# Patient Record
Sex: Female | Born: 2000 | Race: White | Hispanic: No | Marital: Single | Smoking: Never smoker
Health system: Southern US, Community
[De-identification: ages and names within clinical notes are randomized; demographics above are authoritative.]

## PROBLEM LIST (undated history)

## (undated) DIAGNOSIS — J45909 Unspecified asthma, uncomplicated: Secondary | ICD-10-CM

---
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
## 2020-04-16 IMAGING — CT CT ANGIOGRAPHY CHEST
2 of 6 series · 19 of 36 positions shown · IV contrast (APPLIED)
Comparison: Chest x-ray 12/16/2018

CLINICAL DATA: Chest pain

EXAM:
CT ANGIOGRAPHY CHEST WITH CONTRAST
TECHNIQUE: Multidetector CT imaging of the chest was performed using the
standard protocol during bolus administration of intravenous
contrast. Multiplanar CT image reconstructions and MIPs were
obtained to evaluate the vascular anatomy.
CONTRAST:  75mL OMNIPAQUE IOHEXOL 350 MG/ML SOLN

[Series 5: thins · axial · 0.72mm/px · z∈[-540,-316]mm · 18 of 250 slices shown]
[im 13/250  lung]
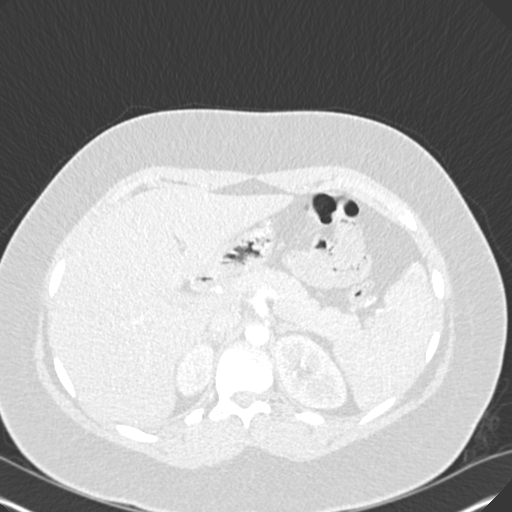
[im 25/250  mediastinal]
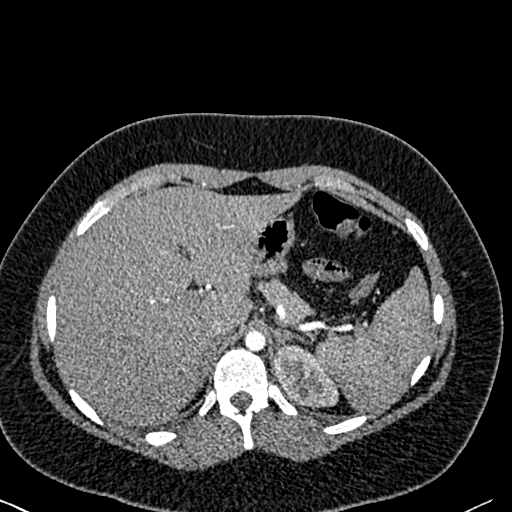
[im 38/250  lung]
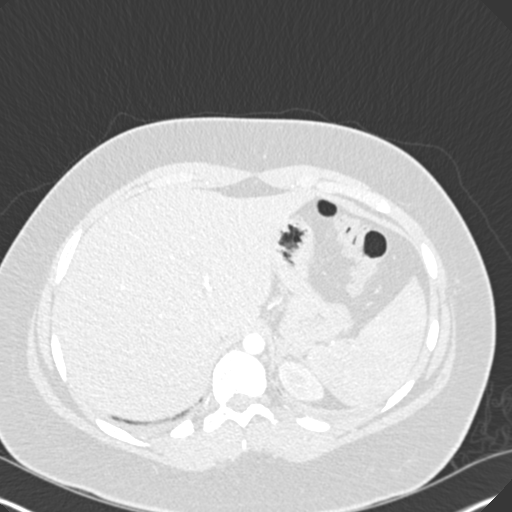
[im 50/250  mediastinal]
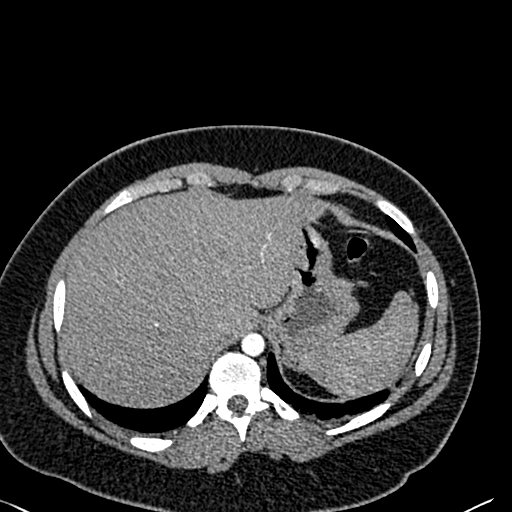
[im 63/250  lung]
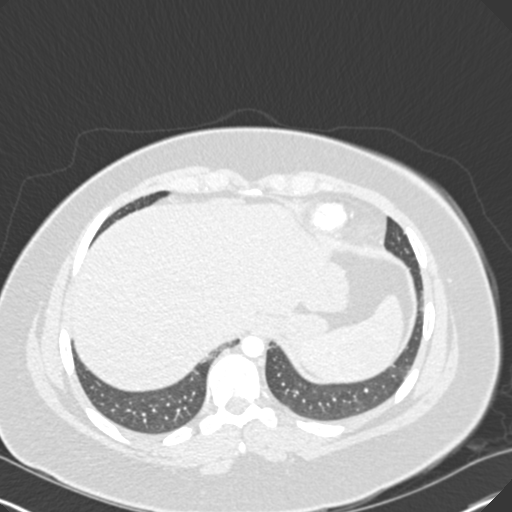
[im 75/250  mediastinal]
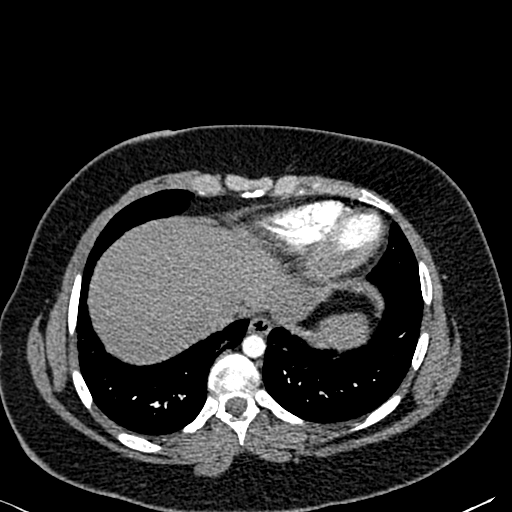
[im 88/250  lung]
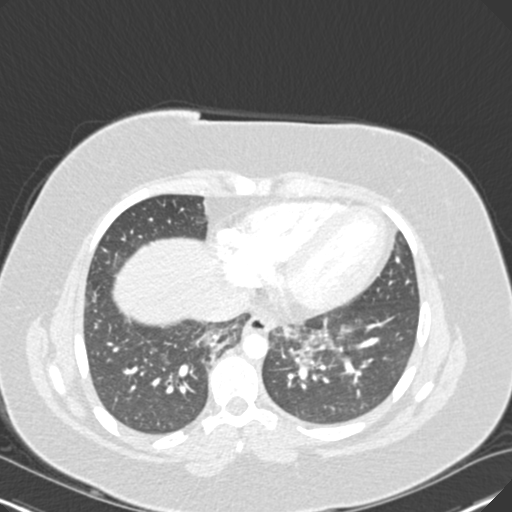
[im 100/250  mediastinal]
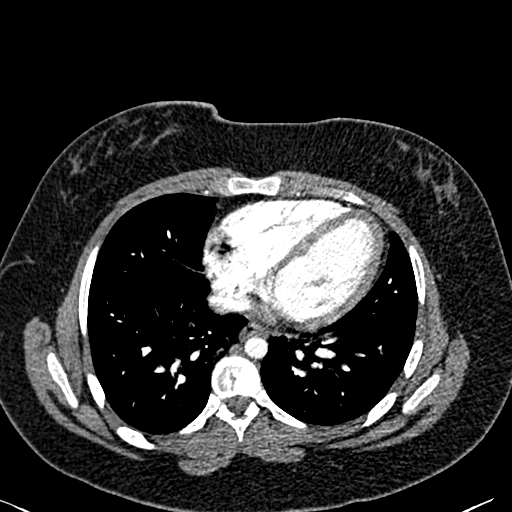
[im 113/250  lung]
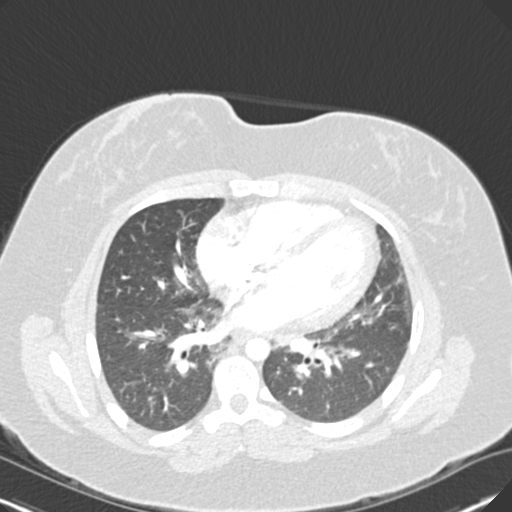
[im 137/250  mediastinal]
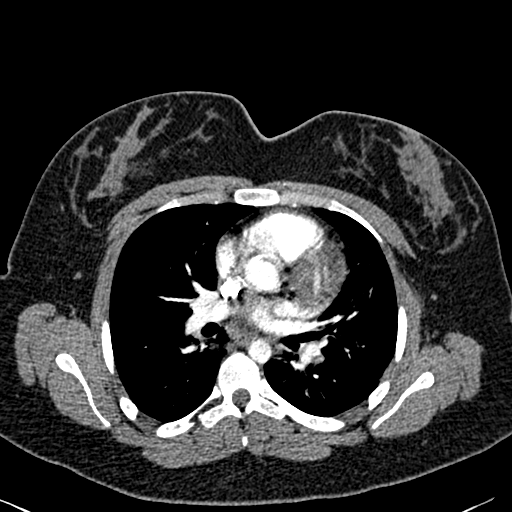
[im 150/250  lung]
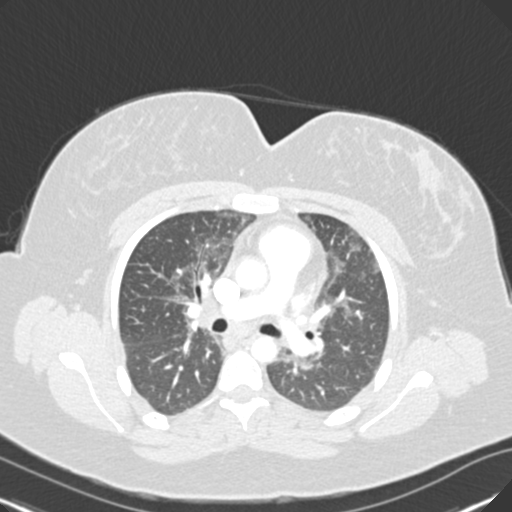
[im 162/250  mediastinal]
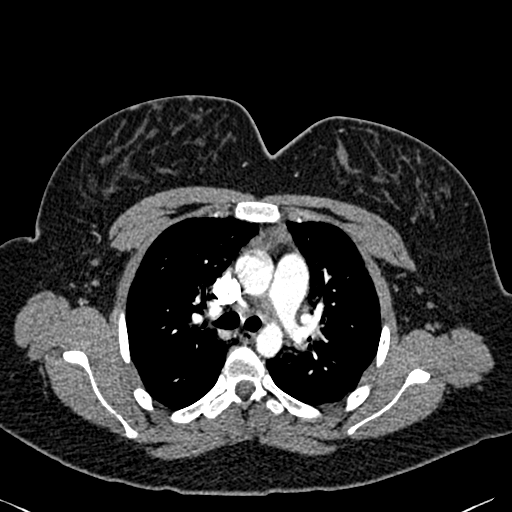
[im 175/250  lung]
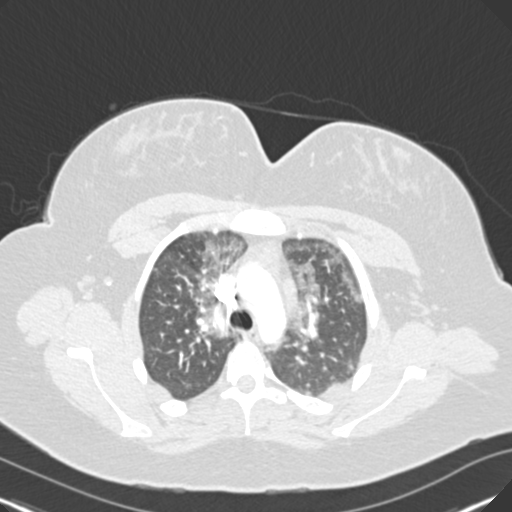
[im 187/250  mediastinal]
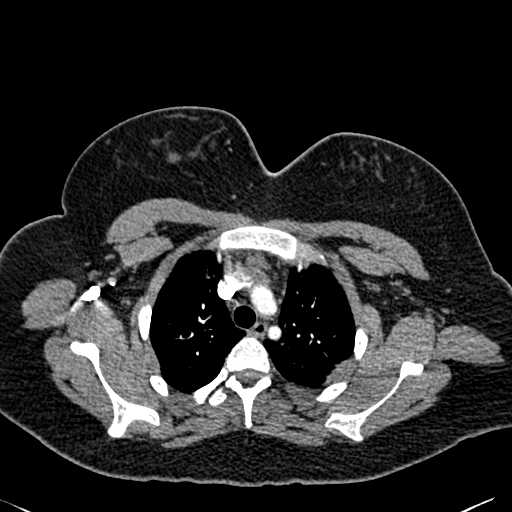
[im 200/250  lung]
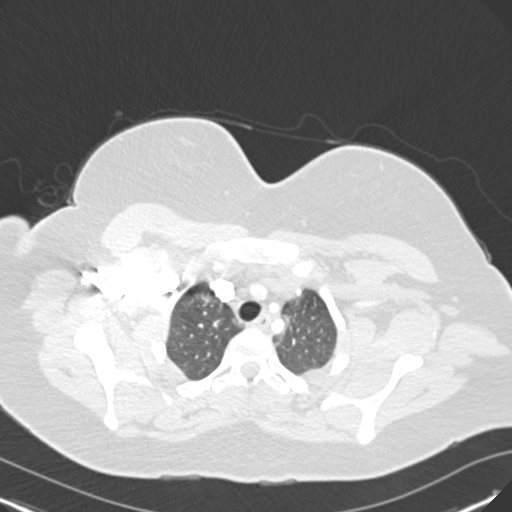
[im 212/250  mediastinal]
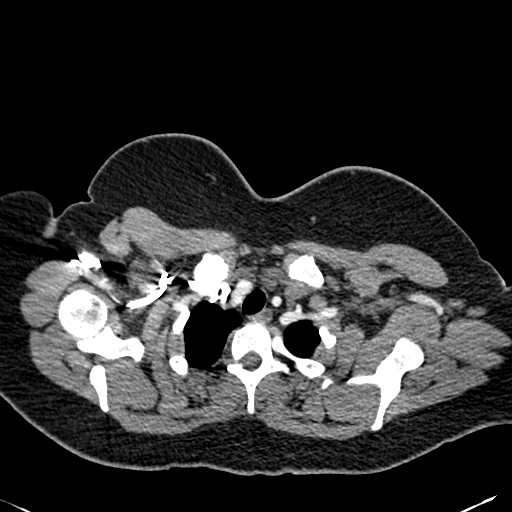
[im 225/250  lung]
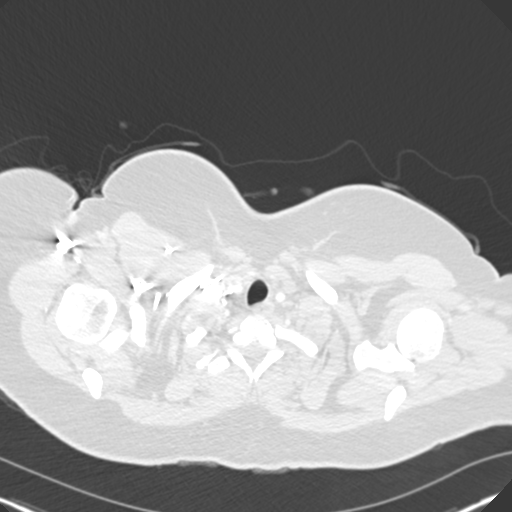
[im 237/250  mediastinal]
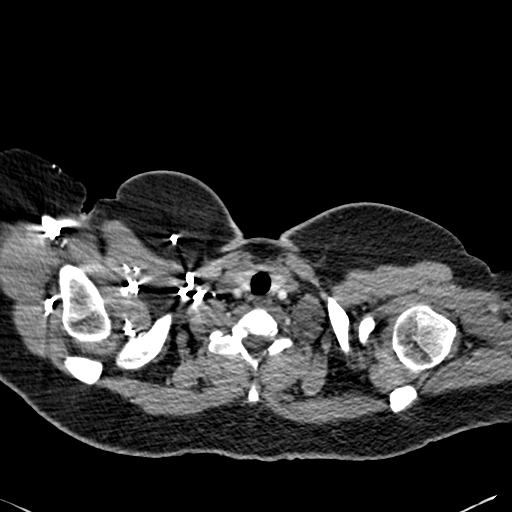

[Series 7: coronal mpr · coronal · 0.49mm/px · 1 of 106 slices shown]
[im 53/106  mediastinal]
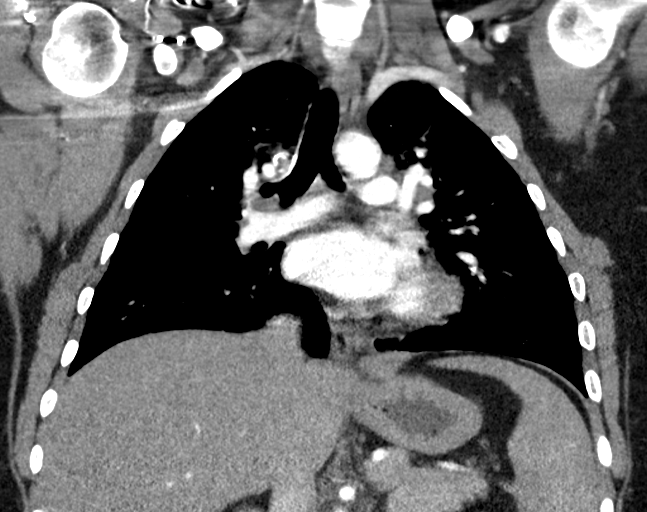

[19 of 36 positions shown; findings below may reference images not displayed]

FINDINGS: Cardiovascular: No filling defects in the pulmonary arteries to
suggest pulmonary emboli. Heart is normal size. Aorta is normal
caliber.

Mediastinum/Nodes: No mediastinal, hilar, or axillary adenopathy.
Soft tissue in the anterior mediastinum felt represent residual
thymus.

Lungs/Pleura: There are patchy ground-glass opacities noted medially
within both upper lobes and also within both lower lobes. No
effusions.

Upper Abdomen: Imaging into the upper abdomen shows no acute
findings.

Musculoskeletal: Chest wall soft tissues are unremarkable. No acute
bony abnormality.

Review of the MIP images confirms the above findings.
IMPRESSION: No evidence of pulmonary embolus.

Patchy bilateral airspace opacities medially in both upper lobes and
lower lobes. There are a spectrum of findings in the lungs which can
be seen with acute atypical infection (as well as other
non-infectious etiologies). In particular, viral pneumonia
(including [id number]) should be considered in the appropriate
clinical setting.

## 2020-04-16 IMAGING — DX PORTABLE CHEST - 1 VIEW
1 series · 1 of 1 positions shown · non-contrast
Comparison: None.

CLINICAL DATA: Chest pain, cough, congestion, shortness of breath

EXAM:
PORTABLE CHEST 1 VIEW

[chest ap]
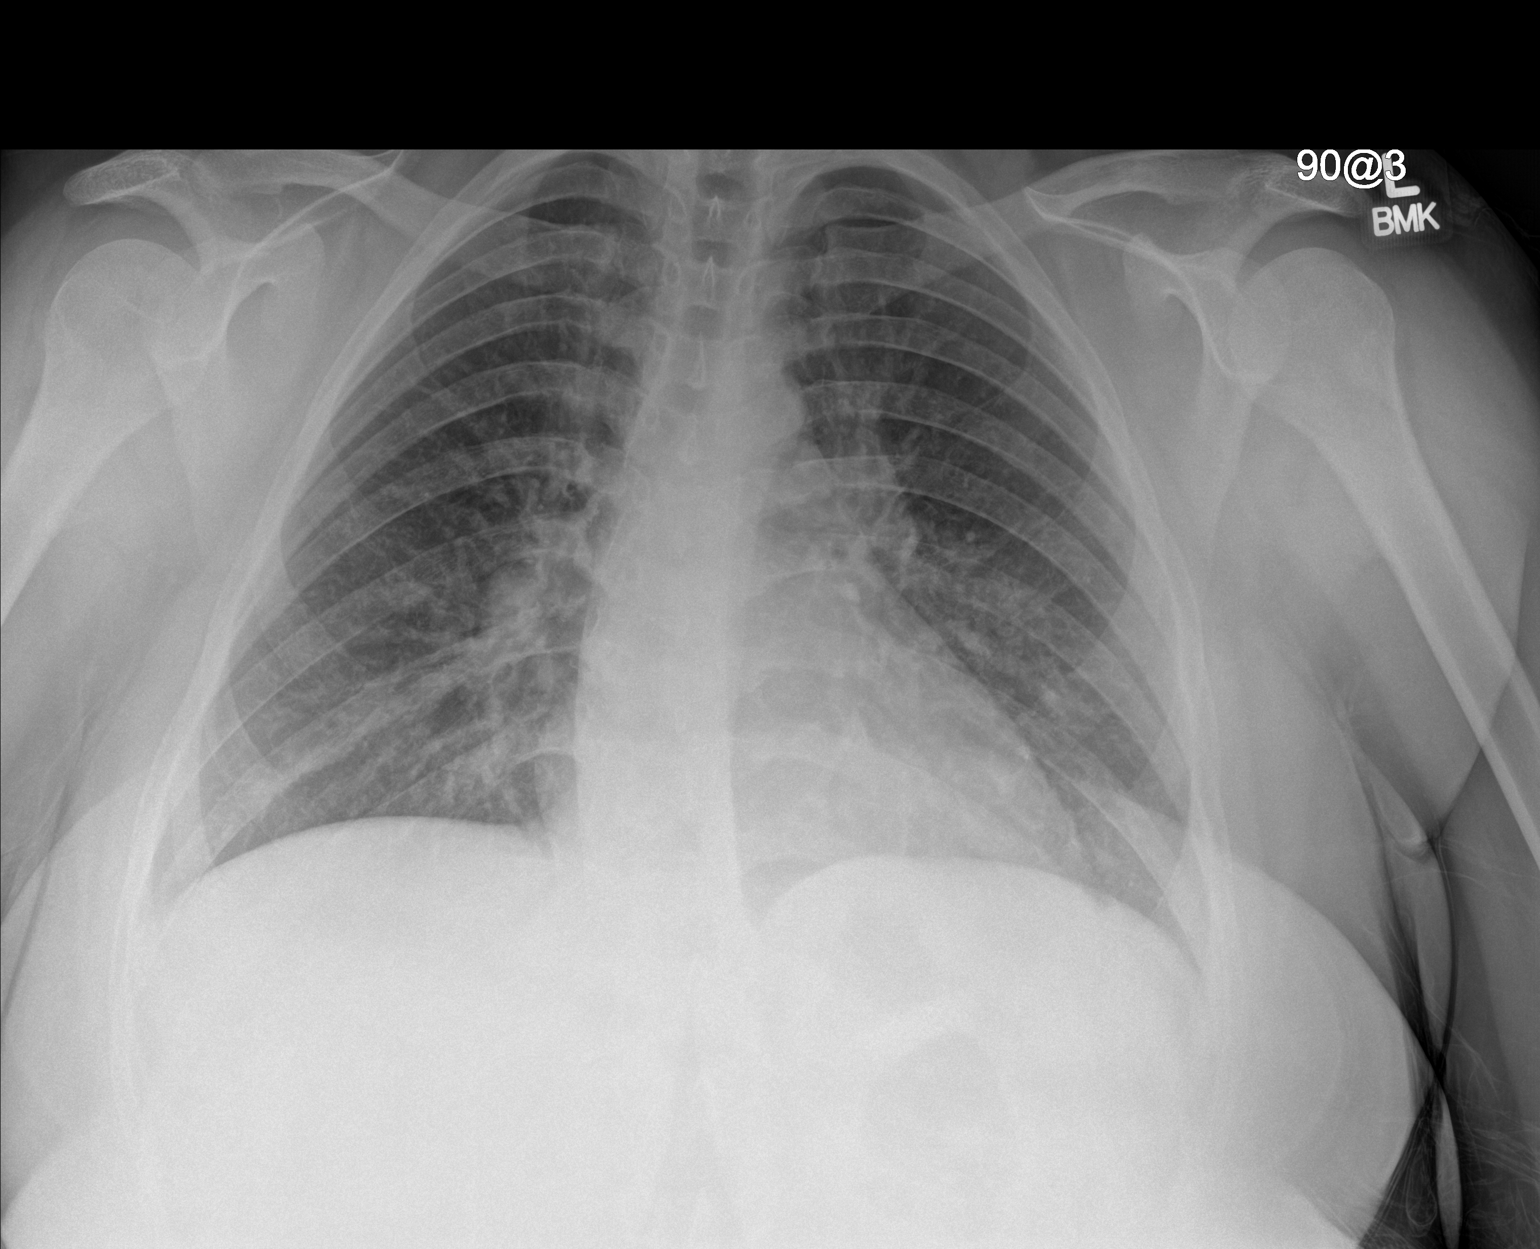

[1 of 1 positions shown; findings below may reference images not displayed]

FINDINGS: Heart and mediastinal contours are within normal limits. No focal
opacities or effusions. No acute bony abnormality.
IMPRESSION: No active disease.
